# Patient Record
Sex: Male | Born: 2000
Health system: Southern US, Community
[De-identification: ages and names within clinical notes are randomized; demographics above are authoritative.]

---
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
## 2023-05-31 ENCOUNTER — Encounter: Payer: Self-pay | Admitting: Physician Assistant

## 2023-05-31 ENCOUNTER — Ambulatory Visit: Payer: Self-pay | Admitting: Physician Assistant

## 2023-05-31 ENCOUNTER — Ambulatory Visit: Payer: Self-pay

## 2023-05-31 VITALS — BP 115/72 | HR 47 | Temp 97.6°F | Resp 14 | Ht 69.0 in | Wt 195.0 lb

## 2023-05-31 DIAGNOSIS — Z0289 Encounter for other administrative examinations: Secondary | ICD-10-CM

## 2023-05-31 DIAGNOSIS — Z021 Encounter for pre-employment examination: Secondary | ICD-10-CM

## 2023-05-31 LAB — POCT URINALYSIS DIPSTICK
Bilirubin, UA: NEGATIVE
Blood, UA: NEGATIVE
Glucose, UA: NEGATIVE
Ketones, UA: NEGATIVE
Leukocytes, UA: NEGATIVE
Nitrite, UA: NEGATIVE
Protein, UA: NEGATIVE
Spec Grav, UA: 1.015 (ref 1.010–1.025)
Urobilinogen, UA: 0.2 E.U./dL
pH, UA: 6 (ref 5.0–8.0)

## 2023-05-31 NOTE — Progress Notes (Signed)
[facility]  ____________________________________________   None    (approximate)  I have reviewed the triage vital signs and the nursing notes.   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 is a 22 y.o. 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  Patient voiced no concerns or complaints.         Past Medical History:  Diagnosis Date   Bronchitis 11/2021   [facility] [place]    Patient Active Problem List   Diagnosis Date Noted   Bronchitis 11/2021   Paronychia of great toe of right foot 06/19/2019    History reviewed. No pertinent surgical history.  Prior to Admission medications   Medication Sig Start Date End Date Taking? Authorizing Provider  Ascorbic Acid (VITAMIN C) 100 MG tablet Take by mouth.   Yes [provider]  meloxicam (MOBIC) 15 MG tablet Take 15 mg by mouth daily. 12/28/22  Yes [provider]    Allergies Patient has no known allergies.  Family History  Problem Relation Age of Onset   Healthy Mother     Social History Social History   Tobacco Use   Smoking status: Never    Passive exposure: Never   Smokeless tobacco: Never  Vaping Use   Vaping status: Never Used  Substance Use Topics   Alcohol use: Never   Drug use: Never    Review of Systems Constitutional: No fever/chills Eyes: No visual changes. ENT: No sore throat. Cardiovascular: Denies chest pain. Respiratory: Denies shortness of breath. Gastrointestinal: No abdominal pain.  No nausea, no vomiting.  No diarrhea.  No constipation. Genitourinary: Negative for dysuria. Musculoskeletal: Negative for back pain. Skin: Negative for rash. Neurological: Negative for headaches, focal weakness or numbness.   ____________________________________________   PHYSICAL EXAM:  VITAL SIGNS: BP 115/72  Pulse  47  Resp 14  Temp 97.6 F (36.4 C)  SpO2 100 %  Weight 195 lb (88.5 kg)  Height 5\' 9"  (1.753 m)   BMI 28.80 kg/m2  BSA 2.08 m2  Tobacco  Smoking status Never  Passive exposure Never  Smokeless status Never   Constitutional: Alert and oriented. Well appearing and in no acute distress. Eyes: Conjunctivae are normal. PERRL. EOMI. Head: Atraumatic. Nose: No congestion/rhinnorhea. Mouth/Throat: Mucous membranes are moist.  Oropharynx non-erythematous. Neck: No stridor.  No cervical spine tenderness to palpation. Hematological/Lymphatic/Immunilogical: No cervical lymphadenopathy. Cardiovascular: Bradycardic, regular rhythm. Grossly normal heart sounds.  Good peripheral circulation. Respiratory: Normal respiratory effort.  No retractions. Lungs CTAB. Gastrointestinal: Soft and nontender. No distention. No abdominal bruits. No CVA tenderness. Genitourinary: Deferred Musculoskeletal: No lower extremity tenderness nor edema.  No joint effusions. Neurologic:  Normal speech and language. No gross focal neurologic deficits are appreciated. No gait instability. Skin:  Skin is warm, dry and intact. No rash noted. Psychiatric: Mood and affect are normal. Speech and behavior are normal.  ____________________________________________   LABS Pending     Component Ref Range & Units 10:59  Color, UA yellow  Clarity, UA clear  Glucose, UA Negative Negative  Bilirubin, UA neg  Ketones, UA neg  Spec Grav, UA 1.010 - 1.025 1.015  Blood, UA neg  pH, UA 5.0 - 8.0 6.0  Protein, UA Negative Negative  Urobilinogen, UA 0.2 or 1.0 E.U./dL 0.2  Nitrite, UA neg  Leukocytes, UA Negative Negative  Appearance        ____________________________________________  EKG  Sinus bradycardia 50 bpm ____________________________________________    ____________________________________________   INITIAL IMPRESSION / ASSESSMENT AND PLAN   As part of my medical decision making, I reviewed the  following data within the electronic MEDICAL RECORD NUMBER       No acute findings on on physical exam or EKG.  Labs are pending.     ____________________________________________   FINAL CLINICAL IMPRESSION Well exam   ED Discharge Orders     None        Note:  This document was prepared using Dragon voice recognition software and may include unintentional dictation errors.

## 2023-05-31 NOTE — Progress Notes (Signed)
Pt presents today to complete pre-employment UDS & FF PHYSICAL.  UDS:  LABS/UA

## 2023-05-31 NOTE — Progress Notes (Signed)
Pt presents today to complete FF pre-employment physical.  Pt cleared UDS. LABS are completed. Pt denies any issues or concerns at this time/CL,RMA

## 2023-06-03 LAB — CMP12+LP+TP+TSH+6AC+CBC/D/PLT
ALT: 26 IU/L (ref 0–44)
AST: 23 IU/L (ref 0–40)
Albumin: 4.5 g/dL (ref 4.3–5.2)
Alkaline Phosphatase: 49 IU/L — ABNORMAL LOW (ref 51–125)
BUN/Creatinine Ratio: 17 (ref 9–20)
BUN: 20 mg/dL (ref 6–20)
Basophils Absolute: 0 10*3/uL (ref 0.0–0.2)
Basos: 1 %
Bilirubin Total: 0.9 mg/dL (ref 0.0–1.2)
Calcium: 9.5 mg/dL (ref 8.7–10.2)
Chloride: 102 mmol/L (ref 96–106)
Chol/HDL Ratio: 2.5 ratio (ref 0.0–5.0)
Cholesterol, Total: 109 mg/dL (ref 100–199)
Creatinine, Ser: 1.19 mg/dL (ref 0.76–1.27)
EOS (ABSOLUTE): 0.1 10*3/uL (ref 0.0–0.4)
Eos: 2 %
Estimated CHD Risk: <0.5 times avg. (ref 0.0–1.0)
Free Thyroxine Index: 1.9 (ref 1.2–4.9)
GGT: 16 IU/L (ref 0–65)
Globulin, Total: 2.1 g/dL (ref 1.5–4.5)
Glucose: 89 mg/dL (ref 70–99)
HDL: 43 mg/dL (ref >39)
Hematocrit: 44.3 % (ref 37.5–51.0)
Hemoglobin: 14.8 g/dL (ref 13.0–17.7)
Immature Grans (Abs): 0 10*3/uL (ref 0.0–0.1)
Immature Granulocytes: 0 %
Iron: 64 ug/dL (ref 38–169)
LDH: 161 IU/L (ref 121–224)
LDL Chol Calc (NIH): 55 mg/dL (ref 0–99)
Lymphocytes Absolute: 2.1 10*3/uL (ref 0.7–3.1)
Lymphs: 37 %
MCH: 29.2 pg (ref 26.6–33.0)
MCHC: 33.4 g/dL (ref 31.5–35.7)
MCV: 88 fL (ref 79–97)
Monocytes Absolute: 0.5 10*3/uL (ref 0.1–0.9)
Monocytes: 8 %
Neutrophils Absolute: 2.9 10*3/uL (ref 1.4–7.0)
Neutrophils: 52 %
Phosphorus: 2.7 mg/dL — ABNORMAL LOW (ref 2.8–4.1)
Platelets: 252 10*3/uL (ref 150–450)
Potassium: 4.4 mmol/L (ref 3.5–5.2)
RBC: 5.06 x10E6/uL (ref 4.14–5.80)
RDW: 12.6 % (ref 11.6–15.4)
Sodium: 138 mmol/L (ref 134–144)
T3 Uptake Ratio: 29 % (ref 24–39)
T4, Total: 6.4 ug/dL (ref 4.5–12.0)
TSH: 2.65 u[IU]/mL (ref 0.450–4.500)
Total Protein: 6.6 g/dL (ref 6.0–8.5)
Triglycerides: 44 mg/dL (ref 0–149)
Uric Acid: 5.4 mg/dL (ref 3.8–8.4)
VLDL Cholesterol Cal: 11 mg/dL (ref 5–40)
WBC: 5.6 10*3/uL (ref 3.4–10.8)
eGFR: 90 mL/min/{1.73_m2} (ref >59)

## 2023-06-03 LAB — QUANTIFERON-TB GOLD PLUS
QuantiFERON Mitogen Value: >10 IU/mL
QuantiFERON Nil Value: 0.01 IU/mL
QuantiFERON TB1 Ag Value: 0.03 IU/mL
QuantiFERON TB2 Ag Value: 0.02 IU/mL
QuantiFERON-TB Gold Plus: NEGATIVE

## 2023-06-03 LAB — HEPATITIS B SURFACE ANTIBODY,QUALITATIVE: Hep B Surface Ab, Qual: NONREACTIVE

## 2023-07-31 ENCOUNTER — Ambulatory Visit (INDEPENDENT_AMBULATORY_CARE_PROVIDER_SITE_OTHER): Payer: BC Managed Care – PPO

## 2023-07-31 ENCOUNTER — Ambulatory Visit
Admission: EM | Admit: 2023-07-31 | Discharge: 2023-07-31 | Disposition: A | Discharge status: Home / Self Care | Payer: BC Managed Care – PPO | Attending: Emergency Medicine | Admitting: Emergency Medicine

## 2023-07-31 DIAGNOSIS — Z1152 Encounter for screening for COVID-19: Secondary | ICD-10-CM | POA: Insufficient documentation

## 2023-07-31 DIAGNOSIS — R079 Chest pain, unspecified: Secondary | ICD-10-CM

## 2023-07-31 DIAGNOSIS — J069 Acute upper respiratory infection, unspecified: Secondary | ICD-10-CM | POA: Insufficient documentation

## 2023-07-31 LAB — SARS CORONAVIRUS 2 BY RT PCR: SARS Coronavirus 2 by RT PCR: NEGATIVE

## 2023-07-31 MED ORDER — NAPROXEN 500 MG PO TABS: 500.0000 mg | ORAL_TABLET | Freq: Two times a day (BID) | ORAL | 0 refills | Status: AC

## 2023-07-31 MED ORDER — PREDNISONE 20 MG PO TABS: 40.0000 mg | ORAL_TABLET | Freq: Every day | ORAL | 0 refills | Status: AC

## 2023-07-31 MED ORDER — ALBUTEROL SULFATE HFA 108 (90 BASE) MCG/ACT IN AERS
1.0000 | INHALATION_SPRAY | RESPIRATORY_TRACT | 0 refills | Status: AC | PRN
Start: 2023-07-31 — End: ?

## 2023-07-31 MED ORDER — PROMETHAZINE-DM 6.25-15 MG/5ML PO SYRP: 5.0000 mL | ORAL_SOLUTION | Freq: Four times a day (QID) | ORAL | 0 refills | Status: AC | PRN

## 2023-07-31 MED ORDER — FLUTICASONE PROPIONATE 50 MCG/ACT NA SUSP: 2.0000 | Freq: Every day | NASAL | 0 refills | Status: AC

## 2023-07-31 NOTE — ED Provider Notes (Signed)
HPI  SUBJECTIVE:  Scott Nash. is a 22 y.o. male who presents with 4 days of fevers Tmax 100, headaches, body aches, fatigue, nausea, dry cough, clear rhinorrhea, postnasal drip, shortness of breath starting 2 days ago.  He was exposed to multiple coworkers with COVID.  He did not get the COVID vaccines.  He had a negative home COVID test.  He reports substernal, achy, seconds long, intermittent chest pain that is present after coughing and with exertion.  He has been doing a lot of coughing.  No radiation of this pain up his neck, down his arm or through to his back.  No accompanying nausea, diaphoresis, exertional component.  No change in his physical activity, but he is very active at his job.  No trauma to the chest.  He is sleeping okay at night with NyQuil.  No antibiotics in the past 3 months.  No antipyretic in the past 6 hours.  He has tried NyQuil, Advil and DayQuil with improvement in his symptoms.  His chest pain is worse with coughing, and when the medication wears off.  He has no medical problems, specifically MI, CVA, PAD/PVD, hypercholesterolemia, diabetes, hypertension, smoking.  Family history significant for grandfather with MI before age 53.  PCP: None.    Past Medical History:  Diagnosis Date   Bronchitis 11/2021   [facility] [place]    History reviewed. No pertinent surgical history.  Family History  Problem Relation Age of Onset   Healthy Mother     Social History   Tobacco Use   Smoking status: Never    Passive exposure: Never   Smokeless tobacco: Never  Vaping Use   Vaping status: Never Used  Substance Use Topics   Alcohol use: Never   Drug use: Never    No current facility-administered medications for this encounter.  Current Outpatient Medications:    albuterol (VENTOLIN HFA) 108 (90 Base) MCG/ACT inhaler, Inhale 1-2 puffs into the lungs every 4 (four) hours as needed for wheezing or shortness of breath., Disp: 1 each, Rfl: 0    fluticasone (FLONASE) 50 MCG/ACT nasal spray, Place 2 sprays into both nostrils daily., Disp: 16 g, Rfl: 0   naproxen (NAPROSYN) 500 MG tablet, Take 1 tablet (500 mg total) by mouth 2 (two) times daily., Disp: 20 tablet, Rfl: 0   predniSONE (DELTASONE) 20 MG tablet, Take 2 tablets (40 mg total) by mouth daily with breakfast for 5 days., Disp: 10 tablet, Rfl: 0   promethazine-dextromethorphan (PROMETHAZINE-DM) 6.25-15 MG/5ML syrup, Take 5 mLs by mouth 4 (four) times daily as needed for cough., Disp: 118 mL, Rfl: 0   Ascorbic Acid (VITAMIN C) 100 MG tablet, Take by mouth., Disp: , Rfl:   No Known Allergies   ROS  As noted in HPI.   Physical Exam  BP (!) 153/94 (BP Location: Left Arm)   Pulse 74   Temp 98.4 F (36.9 C) (Oral)   Resp 16   SpO2 100%   Constitutional: Well developed, well nourished, no acute distress Eyes:  EOMI, conjunctiva normal bilaterally HENT: Normocephalic, atraumatic,mucus membranes moist.  Positive nasal congestion.  No sinus tenderness. Neck: No cervical lymphadenopathy Respiratory: Normal inspiratory effort, lungs clear bilaterally, good air movement.  No anterior, lateral chest wall tenderness Cardiovascular: Normal rate, regular rhythm, no murmurs rubs or gallops. GI: nondistended skin: No rash, skin intact Musculoskeletal: no deformities Neurologic: Alert & oriented x 3, no focal neuro deficits Psychiatric: Speech and behavior appropriate   ED Course  Medications - No data to display  Orders Placed This Encounter  Procedures   SARS Coronavirus 2 by RT PCR (hospital order, performed in [facility] hospital lab) *cepheid single result test* Anterior Nasal Swab    Standing Status:   Standing    Number of Occurrences:   1   DG Chest 2 View    Standing Status:   Standing    Number of Occurrences:   1    Order Specific Question:   Reason for Exam (SYMPTOM  OR DIAGNOSIS REQUIRED)    Answer:   CP SOB r/o PNA   ED EKG    Standing Status:   Standing     Number of Occurrences:   1    Order Specific Question:   Reason for Exam    Answer:   Chest Pain   EKG 12-Lead    Standing Status:   Standing    Number of Occurrences:   1    No results found for this or any previous visit (from the past 24 hour(s)).  No results found.  ED Clinical Impression  1. Viral upper respiratory tract infection with cough      ED Assessment/Plan     EKG: Normal sinus rhythm with sinus arrhythmia, rate 69.  Normal axis, normal intervals.  No hypertrophy.  No ST-T wave changes.  No changes compared to EKG from July 2024.  HEART score:   History: Slightly suspicious 0 EKG: Normal 0 Age: Below 45 0 Risk factors: 1 risk factor-positive family history +1 Troponin: Not available  Total score +1.  Patient is at 0.9 to 1.7% risk of 30-day MACE.    COVID-negative.  Reviewed imaging independently.  No pneumonia as read by me.  See radiology report for full details.  Presentation consistent with a viral respiratory illness with a cough.  His COVID PCR testing is negative.  Chest x-ray, EKG reassuring.  Low suspicion for ACS based on HEARt score of 1.  Formal radiology report pending at this time.  Will contact patient if chest x-ray is read as positive for pneumonia and will prescribe the appropriate antibiotics at that time.  In the meantime, supportive treatment.  Albuterol inhaler with a spacer, prednisone 40 mg for 5 days, Flonase, saline nasal irrigation, Promethazine DM, Mucinex D, Naprosyn/Tylenol.  Follow-up with a primary care provider, will provide list.  2-day work note.  Strict ER return precautions given.  Reviewed radiology report.  Normal chest x-ray.  No evidence of pneumonia.  See radiology report for details.  Discussed labs, imaging, MDM, treatment plan, and plan for follow-up with patient. Discussed sn/sx that should prompt return to the ED. patient agrees with plan.   Meds ordered this encounter  Medications   naproxen (NAPROSYN) 500  MG tablet    Sig: Take 1 tablet (500 mg total) by mouth 2 (two) times daily.    Dispense:  20 tablet    Refill:  0   albuterol (VENTOLIN HFA) 108 (90 Base) MCG/ACT inhaler    Sig: Inhale 1-2 puffs into the lungs every 4 (four) hours as needed for wheezing or shortness of breath.    Dispense:  1 each    Refill:  0   fluticasone (FLONASE) 50 MCG/ACT nasal spray    Sig: Place 2 sprays into both nostrils daily.    Dispense:  16 g    Refill:  0   predniSONE (DELTASONE) 20 MG tablet    Sig: Take 2 tablets (40 mg total) by  mouth daily with breakfast for 5 days.    Dispense:  10 tablet    Refill:  0   promethazine-dextromethorphan (PROMETHAZINE-DM) 6.25-15 MG/5ML syrup    Sig: Take 5 mLs by mouth 4 (four) times daily as needed for cough.    Dispense:  118 mL    Refill:  0      *This clinic note was created using Scientist, clinical (histocompatibility and immunogenetics). Therefore, there may be occasional mistakes despite careful proofreading.  ?    Domenick Gong, MD 08/03/23 [phone]

## 2023-07-31 NOTE — ED Triage Notes (Signed)
[facility] and SOB when taking a deep breath since Saturday. States pain is located at center of chest describes it as chest pressure. 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 States he was tested yesterday and was negative for covid. Treating symptoms with dayquil and nyquil.  Last known fever on Sunday. Hx of bronchitis.

## 2023-07-31 NOTE — Discharge Instructions (Addendum)
COVID test is negative.  Your chest x-ray appears normal to me, however, formal radiology report is still pending.  We will contact you if your chest x-ray read comes back positive for pneumonia as read by radiology.  I will call in the appropriate antibiotics this is the case.  Your EKG was reassuring.  In the meantime, 2 puffs from your albuterol inhaler using your spacer every 4 hours for 2 days, then every 6 hours for 2 days, then as needed.  You can back off on the albuterol if your symptoms start to improve.  Prednisone, Naprosyn combined with past milligrams of Tylenol twice a day will help with pain and inflammation.  Flonase, saline nasal irrigation, Mucinex D for the nasal congestion and to prevent a bacterial sinus infection.  Promethazine DM for cough.  Stop all other cold medications.  Here is a list of primary care providers who are taking new patients:  [facility] [place] Dr. Joseph Berkshire (sports medicine) Dr. Elizabeth Sauer [address] [place] [address] [phone]  [facility] at [facility] [address] [place], [place] [address] [phone]  [facility] [place] [address]  [place] [address]  [phone]  [facility] [address] [place], [place] [address] [phone]  [facility] [address] [place]  [phone] [place], [place] [address]  Here are clinics/ other resources who will see you if you do not have insurance. Some have certain criteria that you must meet. Call them and find out what they are:  [facility]: [address] [place], [place] [address] Phone: [phone] Hours: First and Third Saturdays of each Month, 9 a.m. - 1 p.m.  [facility]: [address], [address] [place], [place] [address] Phone: [phone] Hours: Tuesday, 4 p.m. - 8 p.m. Thursday, 1 p.m. - 8 p.m. Wednesday, 9 a.m. - [facility] [address] [place], [place] [address] Phone:  [phone] Pharmacy Phone Number: [phone] Dental Phone Number: [phone] [facility] Insurance Help: [phone]  Dental Hours: Monday - Thursday, 8 a.m. - 6 p.m.  [facility] [address], [place], [place] [address] Phone: [phone] Pharmacy Phone Number: [phone] [facility] Insurance Help: [phone]  [facility] [address] [place] [place], [place] [address] Phone: [phone] Pharmacy Phone Number: [phone] [facility] Insurance Help: [phone]  [facility] [address] [place], [place] [address] Phone: [phone] [facility] Insurance Help: [phone]   [facility] [address] [place], [place] [address] Phone: [phone]  Go to www.goodrx.com  or www.costplusdrugs.com to look up your medications. This will give you a list of where you can find your prescriptions at the most affordable prices. Or ask the pharmacist what the cash price is, or if they have any other discount programs available to help make your medication more affordable. This can be less expensive than what you would pay with insurance.

## 2024-03-25 ENCOUNTER — Ambulatory Visit: Payer: Self-pay | Admitting: Physician Assistant

## 2024-03-25 ENCOUNTER — Encounter: Payer: Self-pay | Admitting: Physician Assistant

## 2024-03-25 VITALS — BP 129/70 | HR 72 | Resp 16 | Ht 69.0 in | Wt 210.0 lb

## 2024-03-25 DIAGNOSIS — F411 Generalized anxiety disorder: Secondary | ICD-10-CM

## 2024-03-25 NOTE — Progress Notes (Signed)
   Subjective:Anxiety    Patient ID: Scott Nash., male    DOB: 27-Jan-2001, 22 y.o.   MRN: [id number]  HPI Patient states for the past 2 to 3 months he has noticed increased state of anxiety.  Patient states condition is when he is not active.  Patient's anxiety disappear whenever he has to perform stressful situation.  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  Patient states he has states of worrying about things approximately 1 and half a day.  States trouble relaxing feels restless.  Denies suicidal ideation or harm to others.  Patient is in a monogamous relationship and is denying interpersonal problems at this time.   Review of Systems Allergic rhinitis and reactive airway.    Objective:   Physical Exam BP 129/70  Cuff Size Normal  Pulse Rate 72  Weight 210 lb (95.3 kg)  Height 5\' 9"  (1.753 m)  Resp 16   BMI: 31.01 kg/m2  BSA: 2.15 m2  No acute distress.  Patient appears calm and is communicative at this time. HEENT is unremarkable. Neck is supple lymphadenopathy or bruits. Lungs are clear to auscultation. Heart is regular rate and rhythm.  Patient self evaluation with PH Q-9 placed in chart.       Assessment & Plan: Anxiety  Patient referred to [facility] and will follow-up after their evaluation.

## 2024-03-25 NOTE — Progress Notes (Signed)
 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 Since march 2025 Pt presents today with anxiety. Pt is having a hard time at work feeling anxious, its hard to talk about as well with out getting worked up. Pt believes the lack of sleep  is making things worse or causing anxiety.
